# Patient Record
Sex: Male | Born: 1990 | Race: Black or African American | Hispanic: No | Marital: Single | Smoking: Never smoker
Health system: Southern US, Community
[De-identification: ages and names within clinical notes are randomized; demographics above are authoritative.]

---

## 2014-01-24 ENCOUNTER — Emergency Department (HOSPITAL_COMMUNITY): Payer: Managed Care, Other (non HMO)

## 2014-01-24 ENCOUNTER — Encounter (HOSPITAL_COMMUNITY): Payer: Self-pay | Admitting: *Deleted

## 2014-01-24 ENCOUNTER — Emergency Department (HOSPITAL_COMMUNITY)
Admission: EM | Admit: 2014-01-24 | Discharge: 2014-01-24 | Disposition: A | Discharge status: Home / Self Care | Payer: Managed Care, Other (non HMO) | Attending: Emergency Medicine | Admitting: Emergency Medicine

## 2014-01-24 DIAGNOSIS — M722 Plantar fascial fibromatosis: Secondary | ICD-10-CM | POA: Insufficient documentation

## 2014-01-24 DIAGNOSIS — L729 Follicular cyst of the skin and subcutaneous tissue, unspecified: Secondary | ICD-10-CM | POA: Diagnosis not present

## 2014-01-24 DIAGNOSIS — M79671 Pain in right foot: Secondary | ICD-10-CM | POA: Diagnosis present

## 2014-01-24 MED ORDER — NAPROXEN 375 MG PO TABS: 375.0000 mg | ORAL_TABLET | Freq: Two times a day (BID) | ORAL | Status: AC

## 2014-01-24 NOTE — ED Provider Notes (Signed)
CSN: [id number]     Arrival date & time 01/24/14  0707 History   First MD Initiated Contact with Patient 01/24/14 [phone]     Chief Complaint  Patient presents with  . Foot Pain  . Skin Problem     (Consider location/radiation/quality/duration/timing/severity/associated sxs/prior Treatment) Patient is a 24 y.o. male presenting with lower extremity pain. The history is provided by the patient. No language interpreter was used.  Foot Pain Associated symptoms include arthralgias (right foot pain ). Pertinent negatives include no chest pain, chills, fever, neck pain, numbness, rash or weakness.  OBRYAN RADU is a 24 year old male with no known past medical history presenting to the ED with right foot pain has been ongoing for approximately 3 weeks. Patient reported that the foot pain is localized to the ankle, heel, top portion of the right foot described as a sharp shooting pain. Stated that the pain is worse when he walks, especially with step off. Stated that he is unable to apply pressure when standing for long periods of time secondary to pain. 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 Stated that he did encounter some foot swelling, reported that he was off from work for approximately one week was able to rest his foot for 3 days-reported that after the 3 day rest his foot felt somewhat better and the swelling went down. Reported that he has been wrapping the foot, apply icy hot and Epsom salts without relief. Patient reported that he's been experiencing these random bumps localized to his left axilla, right forearm and Center the chest. Reported that and his right forearm has been ongoing for approximately 4-5 years, left axilla 2 years and chest 3 months. Patient reported that she has developed these lesions in the past-does not know why he develops these  lesions. Stated that in the past he has had a couple on his knee and on his arms bilaterally. Denied history of diabetes. Denied falls, injury, foot surgery, changes to skin color, ulcers, travel, calf pain, swelling, pain to the bumps, drainage, fever, chills, numbness or tingling and weakness localized to upper extremities and lower extremities. PCP none   History reviewed. No pertinent past medical history. History reviewed. No pertinent past surgical history. History reviewed. No pertinent family history. History  Substance Use Topics  . Smoking status: Never Smoker   . Smokeless tobacco: Never Used  . Alcohol Use: No    Review of Systems  Constitutional: Negative for fever and chills.  Respiratory: Negative for chest tightness and shortness of breath.   Cardiovascular: Negative for chest pain and leg swelling.  Musculoskeletal: Positive for arthralgias (right foot pain ). Negative for neck pain and neck stiffness.  Skin: Negative for color change, rash and wound.  Neurological: Negative for weakness and numbness.      Allergies  Review of patient's allergies indicates not on file.  Home Medications   Prior to Admission medications   Medication Sig Start Date End Date Taking? Authorizing Provider  naproxen (NAPROSYN) 375 MG tablet Take 1 tablet (375 mg total) by mouth 2 (two) times daily. 01/24/14   Annistyn Depass, PA-C   BP 140/79 mmHg  Pulse 83  Temp(Src) 98.7 F (37.1 C) (Oral)  Resp 16  SpO2 100% Physical Exam  Constitutional: He is oriented to person, place, and time. He appears well-developed and well-nourished. No distress.  HENT:  Head: Normocephalic and atraumatic.  Eyes: Conjunctivae and EOM are normal. Right eye exhibits no discharge. Left eye exhibits no discharge.  Neck: Normal range of motion. Neck supple. No tracheal deviation present.  Cardiovascular: Normal rate, regular rhythm and normal heart sounds.  Exam reveals no friction rub.   No murmur  heard. Pulses:      Radial pulses are 2+ on the right side, and 2+ on the left side.       Dorsalis pedis pulses are 2+ on the right side, and 2+ on the left side.  Negative swelling or pitting edema to the lower extremities bilaterally Negative Homans sign to the right lower extremity  Pulmonary/Chest: Effort normal and breath sounds normal. No respiratory distress. He has no wheezes. He has no rales.  Musculoskeletal: Normal range of motion. He exhibits tenderness. He exhibits no edema.       Right foot: There is tenderness. There is normal range of motion, no bony tenderness, no swelling, normal capillary refill and no crepitus.       Feet:  Negative swelling, erythema, inflammation, lesions, sores, deformities, malalignments identified to the right ankle and right foot. Negative warmth upon palpation. Discomfort upon palpation to the plantar aspect of the foot and calcaneal region. Full range of motion to the digits of right foot without difficulty. Negative Thompson sign. Full range of motion to right hip and right knee.  Lymphadenopathy:    He has no cervical adenopathy.  Neurological: He is alert and oriented to person, place, and time. No cranial nerve deficit. He exhibits normal muscle tone. Coordination normal.  Cranial nerves III-XII grossly intact Strength 5+/5+ to lower extremities bilaterally with resistance applied, equal distribution noted Strength intact to digits of the feet bilaterally Sensation intact with differentiation to sharp and dull touch  Skin: Skin is warm and dry. No rash noted. He is not diaphoretic. No erythema.     Small raised lesions that are mobile and soft upon palpation localized left axilla, lower aspect of sternum, extensor aspect of the right forearm. Negative erythema, swelling, cellulitic infection noted. Negative induration or fluctuance identified. Soft, mobile, nontender upon palpation. Left axilla measuring approximately 2 mm x 2 mm Lower aspect  of sternum measuring approximately 3 mm x 3 mm Right forearm measuring approximately 2 mm x 1.5 mm  Psychiatric: He has a normal mood and affect. His behavior is normal. Thought content normal.  Nursing note and vitals reviewed.   ED Course  Procedures (including critical care time) Labs Review Labs Reviewed - No data to display  Imaging Review Dg Ankle Complete Right  01/24/2014   CLINICAL DATA:  Three-week history of pain.  No trauma history.  EXAM: RIGHT ANKLE - COMPLETE 3+ VIEW  COMPARISON:  None.  FINDINGS: Frontal, oblique, and lateral views were obtained. There is no fracture or joint effusion. Ankle mortise appears intact. There is no demonstrable plantar calcification. Pes planus.  IMPRESSION: There is pes planus. No fracture. Mortise intact. No abnormal calcification.   Electronically Signed   By: Bretta Bang M.D.   On: 01/24/2014 08:21   Dg Foot Complete Right  01/24/2014   CLINICAL DATA:  Three-week history of foot pain.  No trauma history  EXAM: RIGHT FOOT COMPLETE - 3+ VIEW  COMPARISON:  None.  FINDINGS: Frontal, oblique, and lateral views were obtained. There is pes planus. No fracture or dislocation. Joint spaces appear intact. No erosive change. No appreciable plantar calcification.  IMPRESSION: No fracture or dislocation. No appreciable  arthropathy. Pes planus.   Electronically Signed   By: Bretta BangWilliam  Woodruff M.D.   On: 01/24/2014 08:20     EKG Interpretation None      MDM   Final diagnoses:  Plantar fasciitis of right foot  Cyst of skin    Medications - No data to display  Filed Vitals:   01/24/14 0729  BP: 140/79  Pulse: 83  Temp: 98.7 F (37.1 C)  TempSrc: Oral  Resp: 16  SpO2: 100%   Plain film of right foot no fracture or dislocation identified-no acute abnormality. Plain film of right ankle negative for acute osseous injury or abnormal calcification. Negative findings of ischemia. Negative focal neurological deficits. Doubt compartment syndrome.  Doubt Achilles tendon rupture. Doubt abscess. Doubt tensynovitis. Doubt blood clot. Suspicion high for plantar fascitis secondary to being on feet and labor. Definitive etiology of cystlike lesions in the left axilla, inferior aspect of sternum, right forearm that is been ongoing for many years unknown-patient has a long history of this. Doubt rash of any form of emergency. Doubt abscess or any form of infectious processes. Doubt tumor. The cystlike lesions have been present for many years have been unchanged. Negative signs of respiratory distress. Patient stable, afebrile. Patient not septic appearing. Discharged patient. Placed patient in postop shoe for comfort. Discussed with patient to rest, ice, elevate and massage. Discharge patient with NSAIDs. For [facility] to [facility], orthopedics, and dermatologist. Discussed with patient to closely monitor symptoms and if symptoms are to worsen or change to report back to the ED - strict return instructions given.  Patient agreed to plan of care, understood, all questions answered.    Raymon MuttonMarissa Richie Bonanno, PA-C 01/24/14 [id number]  Raymon MuttonMarissa Tashina Credit, PA-C 01/24/14 [id number]  Raymon MuttonMarissa Rylie Knierim, PA-C 01/24/14 [id number]  Linwood DibblesJon Knapp, MD 01/24/14 [phone]

## 2014-01-24 NOTE — Discharge Instructions (Signed)
Please call your doctor for a followup appointment within 24-48 hours. When you talk to your doctor please let them know that you were seen in the emergency department and have them acquire all of your records so that they can discuss the findings with you and formulate a treatment plan to fully care for your new and ongoing problems. Please follow-up with health and wellness Center Please follow-up with dermatologist regarding cyst like lesions on the skin Please follow-up with orthopedics regarding right foot pain Please keep foot in postop shoe for comfort purposes. Please rest, ice, elevate-toes above nose Please massage with icy hot ointment Please avoid any physical strenuous activity Please take medications as prescribed and on a full stomach Please continue to monitor symptoms closely and if symptoms are to worsen or change (fever greater than 101, chills, sweating, nausea, vomiting, chest pain, shortness of breathe, difficulty breathing, weakness, numbness, tingling, worsening or changes to pain pattern, swelling, loss of sensation, fall, injury, red streaks, increased size to the cysts with drainage or bleeding) please report back to the Emergency Department immediately.    Plantar Fasciitis Plantar fasciitis is a common condition that causes foot pain. It is soreness (inflammation) of the band of tough fibrous tissue on the bottom of the foot that runs from the heel bone (calcaneus) to the ball of the foot. The cause of this soreness may be from excessive standing, poor fitting shoes, running on hard surfaces, being overweight, having an abnormal walk, or overuse (this is common in runners) of the painful foot or feet. It is also common in aerobic exercise dancers and ballet dancers. SYMPTOMS  Most people with plantar fasciitis complain of:  Severe pain in the morning on the bottom of their foot especially when taking the first steps out of bed. This pain recedes after a few minutes of  walking.  Severe pain is experienced also during walking following a long period of inactivity.  Pain is worse when walking barefoot or up stairs DIAGNOSIS   Your caregiver will diagnose this condition by examining and feeling your foot.  Special tests such as X-rays of your foot, are usually not needed. PREVENTION   Consult a sports medicine professional before beginning a new exercise program.  Walking programs offer a good workout. With walking there is a lower chance of overuse injuries common to runners. There is less impact and less jarring of the joints.  Begin all new exercise programs slowly. If problems or pain develop, decrease the amount of time or distance until you are at a comfortable level.  Wear good shoes and replace them regularly.  Stretch your foot and the heel cords at the back of the ankle (Achilles tendon) both before and after exercise.  Run or exercise on even surfaces that are not hard. For example, asphalt is better than pavement.  Do not run barefoot on hard surfaces.  If using a treadmill, vary the incline.  Do not continue to workout if you have foot or joint problems. Seek professional help if they do not improve. HOME CARE INSTRUCTIONS   Avoid activities that cause you pain until you recover.  Use ice or cold packs on the problem or painful areas after working out.  Only take over-the-counter or prescription medicines for pain, discomfort, or fever as directed by your caregiver.  Soft shoe inserts or athletic shoes with air or gel sole cushions may be helpful.  If problems continue or become more severe, consult a sports medicine caregiver or  your own health care provider. Cortisone is a potent anti-inflammatory medication that may be injected into the painful area. You can discuss this treatment with your caregiver. MAKE SURE YOU:   Understand these instructions.  Will watch your condition.  Will get help right away if you are not doing  well or get worse. Document Released: 09/27/2000 Document Revised: 03/27/2011 Document Reviewed: 11/27/2007 [facility] Patient Information 2015 [place], [place]. This information is not intended to replace advice given to you by your health care provider. Make sure you discuss any questions you have with your health care provider.   Emergency Department Resource Guide 1) Find a Doctor and Pay Out of Pocket Although you won't have to find out who is covered by your insurance plan, it is a good idea to ask around and get recommendations. You will then need to call the office and see if the doctor you have chosen will accept you as a new patient and what types of options they offer for patients who are self-pay. Some doctors offer discounts or will set up payment plans for their patients who do not have insurance, but you will need to ask so you aren't surprised when you get to your appointment.  2) Contact Your Local Health Department Not all health departments have doctors that can see patients for sick visits, but many do, so it is worth a call to see if yours does. If you don't know where your local health department is, you can check in your phone book. The CDC also has a tool to help you locate your state's health department, and many state websites also have listings of all of their local health departments.  3) Find a Walk-in Clinic If your illness is not likely to be very severe or complicated, you may want to try a walk in clinic. These are popping up all over the country in pharmacies, drugstores, and shopping centers. They're usually staffed by nurse practitioners or physician assistants that have been trained to treat common illnesses and complaints. They're usually fairly quick and inexpensive. However, if you have serious medical issues or chronic medical problems, these are probably not your best option.  No Primary Care Doctor: - Call Health Connect at  [phone] - they can help you locate  a primary care doctor that  accepts your insurance, provides certain services, etc. - Physician Referral Service- [phone]  Chronic Pain Problems: Organization         Address  Phone   Notes  [facility]  [phone] Patients need to be referred by their primary care doctor.   Medication Assistance: Organization         Address  Phone   Notes  [facility] Medication [facility] [address] [place] [place], [place] [address] [phone] --Must be a resident of [facility] -- Must have NO insurance coverage whatsoever (no Medicaid/ Medicare, etc.) -- The pt. MUST have a primary care doctor that directs their care regularly and follows them in the community   [facility]ist  [phone]   [employer]  [phone]    Agencies that provide inexpensive medical care: Organization         Address  Phone   Notes  [facility]  [phone]   [facility]    [phone]   [facility] [address] [place], [place] [address] [phone]   [facility] [place] [address] [place]  [address] [place] [phone]   Planned Parenthood    [phone]   [facility]    [phone]   [place] [address], [place] Phone:  [phone], Fax:  [phone] Hours of Operation:  9 am - 6 pm, M-F.  Also accepts Medicaid/Medicare and self-pay.  [facility] for [place], [address], [place] Phone: [phone], Fax: [phone]. Hours of Operation:  8:30 am - 5:30 pm, M-F.  Also accepts Medicaid and self-pay.  [facility] [place] [address], [place] Phone: [phone]   [place], [place], [place] [phone], Ext. [phone] Mondays & Thursdays: 7-9 AM.  First 15 patients are seen on a first come, first serve basis.    [place]  Providers:  Organization         Address  Phone   Notes  [facility] [address],  [phone] Also accepts self-pay patients.  [facility] [address] [place], [place]  [phone]   [place], [address], [place] [phone]   [facility] [address] [place] [phone]   Lucianne Lei [address], [place]   [phone] Only accepts [place] Access [place] patients after they have their name applied to their card.   Self-Pay (no insurance) in [facility]:  Organization         Address  Phone   Notes  Sickle Cell Patients, [facility] Internal Medicine [place] [phone]   [facility] Urgent Care [place] [phone]   [facility] [place]  [place], [place], [place] [phone]   [facility]/Dr. Osei-Bonsu  [address] [place] or [address], [place] [phone] Phone number for both [place] and [place] locations is the same.  [facility] [address] [place] [phone]   [facility] [address] [place] or [address] [phone]   [facility] [address], [place] [phone], phone; [phone], fax Sees patients 1st and 3rd Saturday of every month.  Must not qualify for public or private insurance (i.e. Medicaid, Medicare, [place] Health Choice, Veterans' Benefits)  Household income should be no more than 200% of the poverty level The clinic cannot treat you if you are pregnant or think you are pregnant  Sexually transmitted diseases are not treated at the clinic.    Dental Care: Organization         Address  Phone  Notes  [facility] Department of [facility] [phone] Accepts children up to age 23 who are enrolled in [place] or [place]; pregnant women with a Medicaid card; and children who have applied for Medicaid or [place] Health Choice, but were declined, whose parents can pay a reduced fee at time of service.  [facility] Department of [facility]  [address], [place] [phone] Accepts children up to age 47 who are enrolled in [place] or [place]; pregnant women with a Medicaid card; and children who have applied for Medicaid  or [place] Health Choice, but were declined, whose parents can pay a reduced fee at time of service.  [facility]  [address] [place], [place] [phone] Patients are seen by appointment only. Walk-ins are not accepted. [facility] will see patients 58 years of age and older. Monday - Tuesday (8am-5pm) Most Wednesdays (8:30-5pm) $30 per visit, cash only  [facility] Adult [facility] Access PRO[facility]  [address], [facility] [phone] Patients are seen by appointment only. Walk-ins are not accepted. [facility] will see patients 81 years of age and older. One Wednesday Evening (Monthly: Volunteer Based).  $30 per visit, cash only  [employer]  [phone] for adults; Children under age 6, call Graduate Pediatric Dentistry at [phone]. Children aged 84-14, please call [phone] to request a pediatric application.  Dental services are provided in all areas of dental care including fillings, crowns and bridges, complete and partial dentures, implants, gum treatment, root canals, and extractions. Preventive care is also provided. Treatment is provided to both adults and children. Patients are selected via a lottery and there is often a waiting list.   [facility] [address] [place]  [phone] [email or web address]   [facility]  [address] [place], [place] [phone], Ext. [phone] Second and Fourth Thursday of each month, opens at 6:30 AM; Clinic ends at 9 AM.  Patients are seen on a first-come first-served basis, and a limited number are seen during each clinic.   [facility]  [address] Ether Griffins [place], [place] [phone]   Eligibility Requirements You must have lived in [place], [place], or [place] counties for at least the last three months.   You cannot be eligible for state or federal sponsored [place], including [employer], [place], or [employer].   You generally cannot be eligible for healthcare insurance through your employer.    How to apply: Eligibility screenings are held every Tuesday and Wednesday afternoon from 1:00 pm until 4:00 pm. You do not need an appointment for the interview!  [facility] [address] [place], [place] [phone]   [facility] Health Department  [phone]   [facility] Health Department  [phone]   [facility] Health Department  [phone]    Behavioral Health Resources in the Community: Intensive Outpatient Programs Organization         Address  Phone  Notes  [facility] [address], [place], [place] [phone]   [facility] Outpatient [address], [place], [place] [phone]   [facility] [address] [place], [place]  [phone]   [facility] Health [address],  [place], [place] [phone] or [phone]   Substance Abuse Resources Organization         Address  Phone  Notes  [facility]  [phone]   [facility]  [phone]   [facility] [place]  [phone]   Floydene Flock  [phone]   Residential & Outpatient Substance Abuse Program  [phone]   Psychological Services Organization         Address  Phone  Notes  [facility]  [phone]     [facility]  [phone]   [facility] Mental Health [address], [place] [phone] or [phone]    Mobile Crisis Teams Organization         Address  Phone  Notes  [facility], Mobile Crisis Care Unit  [phone]   [facility]  [address] [place], [place] [phone]   [facility] [address] [place] [phone]    Self-Help/Support Groups Organization         Address  Phone             Notes  [facility] [place] - variety of support groups  336- I7437963 Call for more information  Narcotics Anonymous (NA), [facility] [address], [employer] [place]  2 meetings at this location   Statistician         Address  Phone  Notes  [facility] [address],    [place]  [phone]   [facility]  [address], [place] [address], [place], [place] [phone]   [facility] Treatment Facility [address] [place], [place] [phone] Admissions: 8am-3pm M-F  [facility] [address],    [place], [place] [phone]   [facility] [address] [place], [place], [place] [phone]   [facility] [address]  [place], [place] [phone]   [facility] - Intensive Outpatient [address], [place], [place] [phone]   [facility] ([facility] [address] [place].,  [place], [place] [phone] or [phone]   [facility] ([facility]) [address], [place], [place] [phone] Accepts Medicaid  [facility] [address]  [place] [phone] Substance Abuse/Addiction Treatment   [facility] Organization         Address  Phone  Notes  [facility]  [phone]   Angie Fava, PhD [address] [place]vin Knack [place], [place]   [phone] or [phone]    [facility] Behavioral   [address] [place], [place] [phone]   [facility] [address] [place], [place] [phone] Insurance/Medicaid/sponsorship through [facility] and Families [address]                                    [place], [place] [phone] Therapy/tele-psych/case  [facility] [address], [place] [phone]    Dr. Lolly Mustache  [phone]   [facility]  [employer] [facility] Dept. 1) [address], [place] 2) [address] [place] 3)  [address], [place] [phone]  [phone]   [facility] Child Abuse Hotline (409) [phone] or [phone] (After Hours)

## 2014-01-24 NOTE — ED Notes (Signed)
Declined W/C at D/C and was escorted to lobby by RN. 

## 2014-01-24 NOTE — ED Notes (Signed)
Pt reports a 3 week HX of RT foot pain. 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  Pt also has lump on Rt arm ,ABD and back to check.

## 2015-09-20 IMAGING — DX DG FOOT COMPLETE 3+V*R*
3 series · 3 of 3 positions shown · non-contrast
Comparison: None.

CLINICAL DATA: Three-week history of foot pain.  No trauma history

EXAM:
RIGHT FOOT COMPLETE - 3+ VIEW

[foot ap]
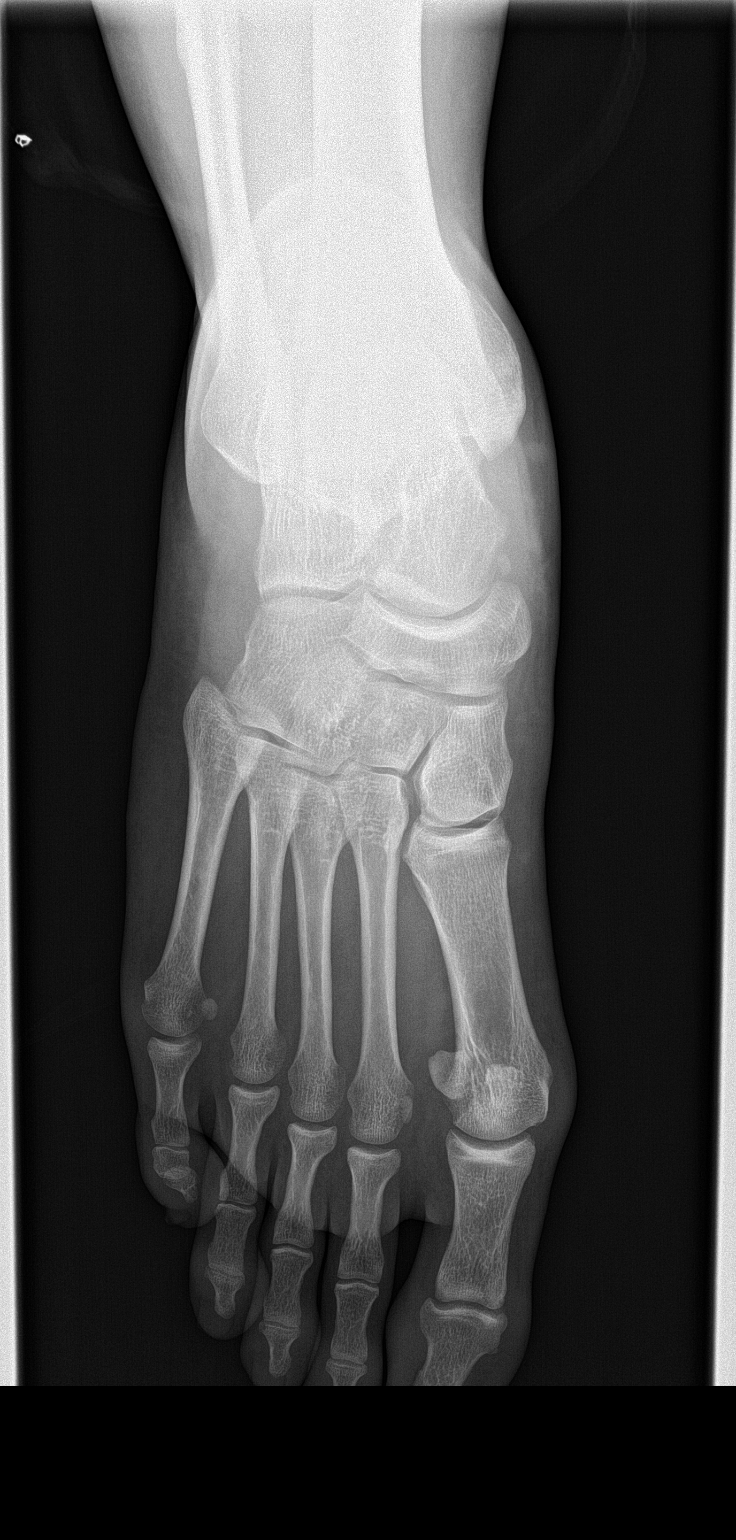

[foot obl]
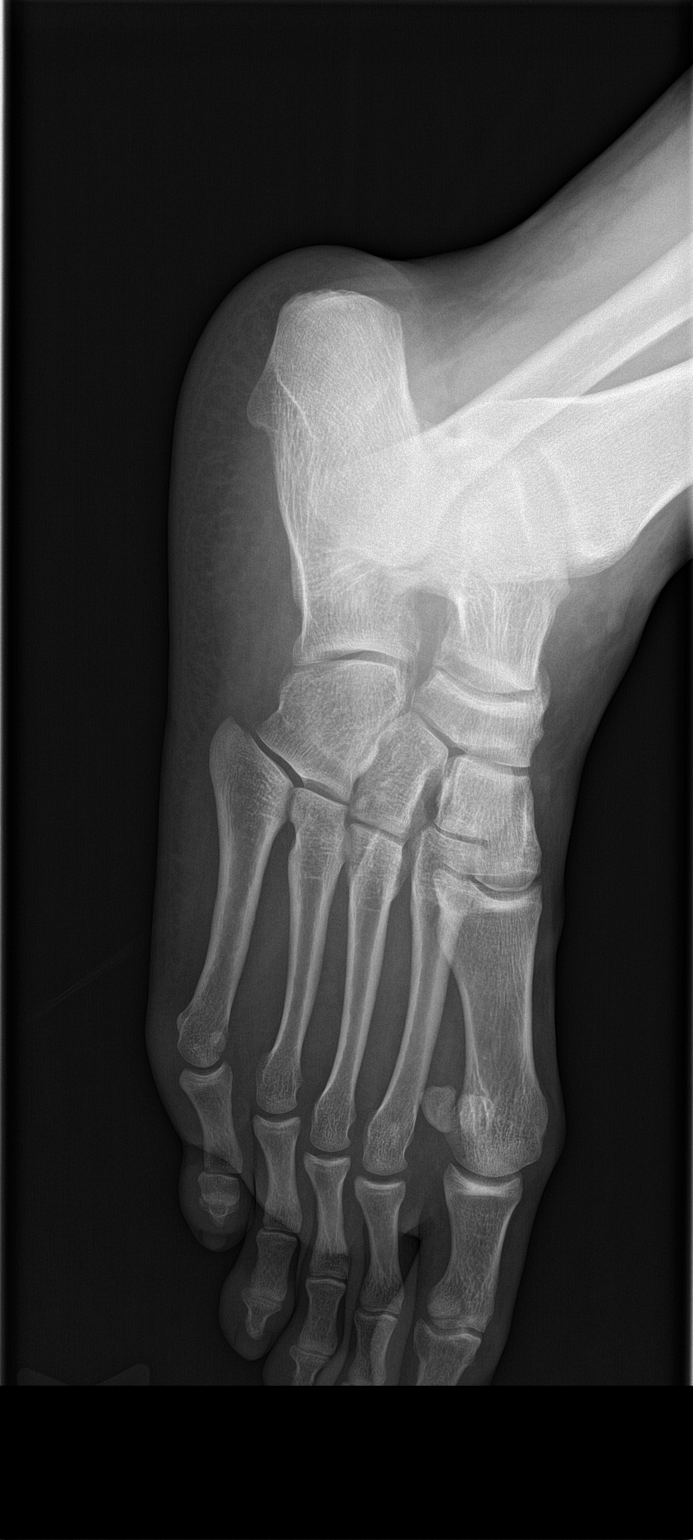

[foot lat]
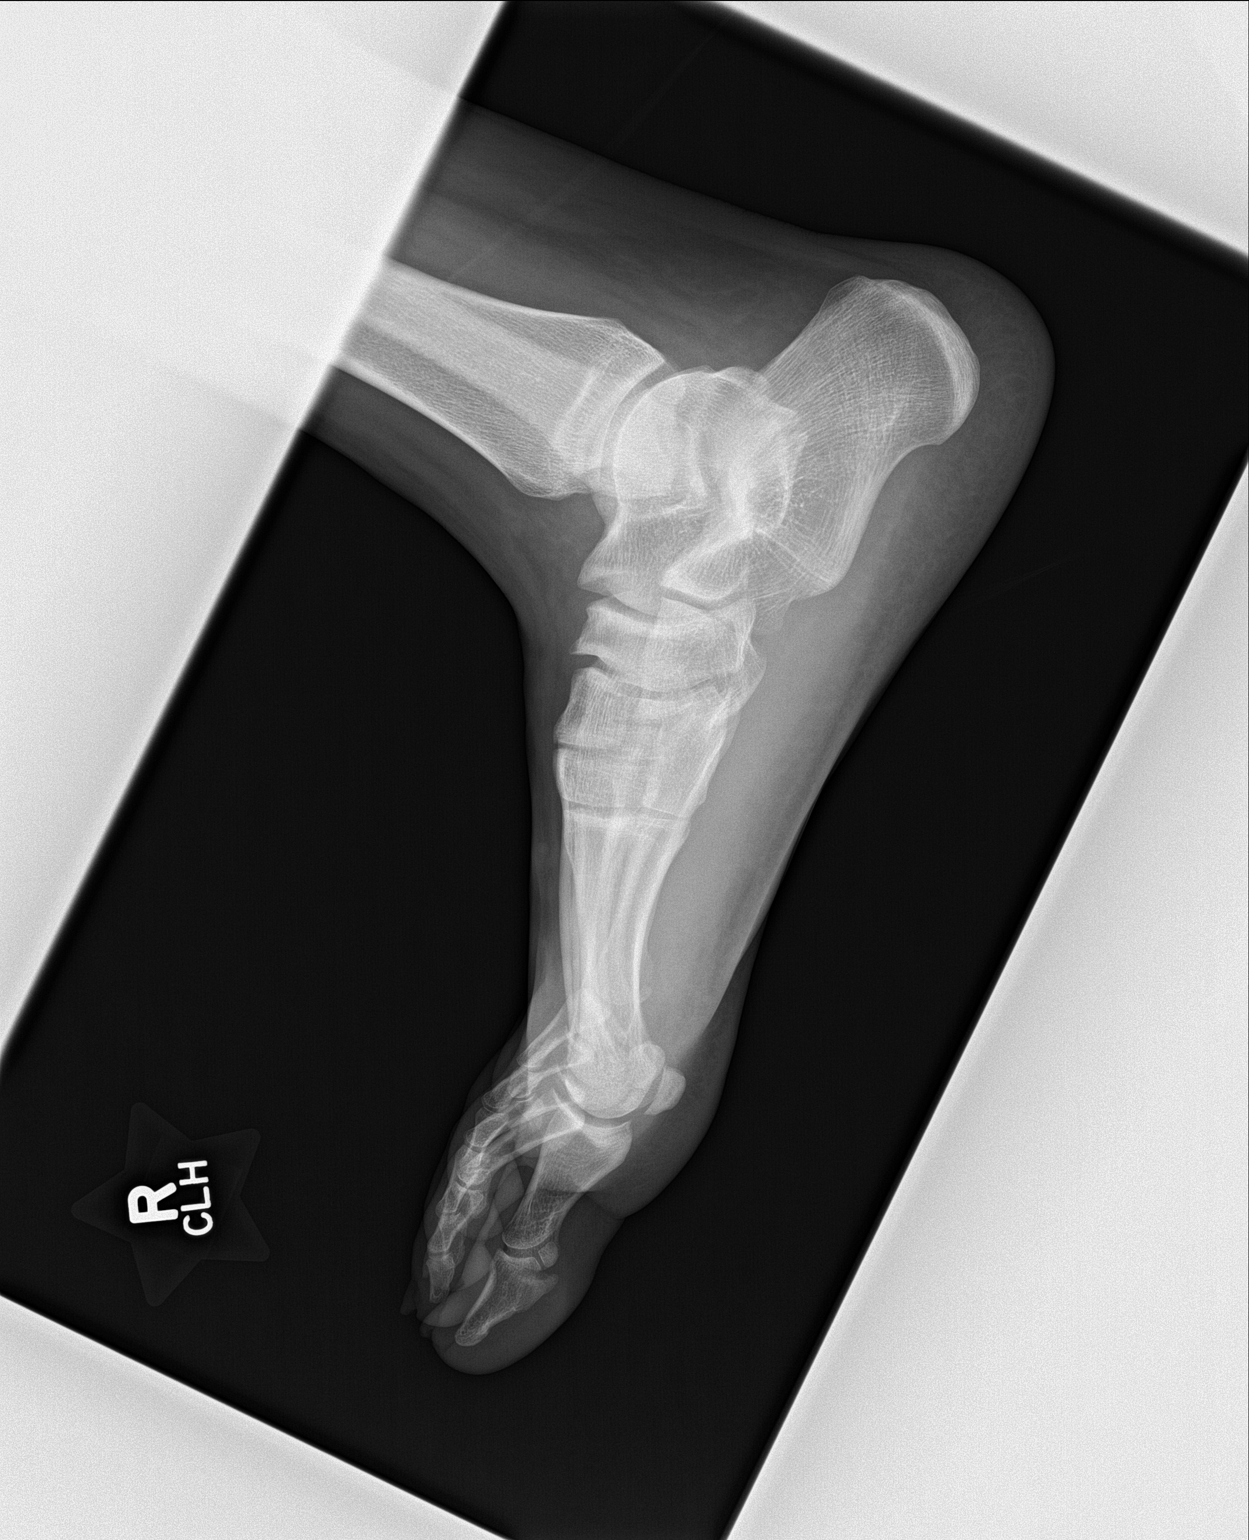

[3 of 3 positions shown; findings below may reference images not displayed]

FINDINGS: Frontal, oblique, and lateral views were obtained. There is pes
planus. No fracture or dislocation. Joint spaces appear intact. No
erosive change. No appreciable plantar calcification.
IMPRESSION: No fracture or dislocation. No appreciable arthropathy. Pes planus.
